# Patient Record
Sex: Female | Born: 1970 | Hispanic: No | Marital: Married | Smoking: Never smoker
Health system: Southern US, Community
[De-identification: ages and names within clinical notes are randomized; demographics above are authoritative.]

## PROBLEM LIST (undated history)

## (undated) DIAGNOSIS — Z789 Other specified health status: Secondary | ICD-10-CM

---

## 1998-08-05 HISTORY — PX: OTHER SURGICAL HISTORY: SHX169

## 2002-08-05 HISTORY — PX: SEPTOPLASTY: SUR1290

## 2009-06-13 ENCOUNTER — Other Ambulatory Visit: Admission: RE | Admit: 2009-06-13 | Discharge: 2009-06-13 | Payer: Self-pay | Admitting: Family Medicine

## 2009-09-26 ENCOUNTER — Other Ambulatory Visit: Admission: RE | Admit: 2009-09-26 | Discharge: 2009-09-26 | Payer: Self-pay | Admitting: Family Medicine

## 2010-08-26 ENCOUNTER — Encounter: Payer: Self-pay | Admitting: Family Medicine

## 2010-10-10 ENCOUNTER — Other Ambulatory Visit (HOSPITAL_COMMUNITY)
Admission: RE | Admit: 2010-10-10 | Discharge: 2010-10-10 | Disposition: A | Discharge status: Home / Self Care | Payer: BC Managed Care – PPO | Source: Ambulatory Visit | Attending: Family Medicine | Admitting: Family Medicine

## 2010-10-10 ENCOUNTER — Other Ambulatory Visit: Payer: Self-pay | Admitting: Family Medicine

## 2010-10-10 DIAGNOSIS — Z124 Encounter for screening for malignant neoplasm of cervix: Secondary | ICD-10-CM | POA: Insufficient documentation

## 2012-05-26 ENCOUNTER — Encounter (INDEPENDENT_AMBULATORY_CARE_PROVIDER_SITE_OTHER): Payer: Self-pay | Admitting: Surgery

## 2012-05-29 ENCOUNTER — Ambulatory Visit (INDEPENDENT_AMBULATORY_CARE_PROVIDER_SITE_OTHER): Payer: BC Managed Care – PPO | Admitting: General Surgery

## 2012-05-29 ENCOUNTER — Encounter (INDEPENDENT_AMBULATORY_CARE_PROVIDER_SITE_OTHER): Payer: Self-pay | Admitting: General Surgery

## 2012-05-29 VITALS — BP 116/81 | HR 83 | Temp 98.2°F | Resp 16 | Ht 63.0 in | Wt 118.6 lb

## 2012-05-29 DIAGNOSIS — N63 Unspecified lump in unspecified breast: Secondary | ICD-10-CM | POA: Insufficient documentation

## 2012-06-03 NOTE — Progress Notes (Signed)
Patient ID: Shannon Skiff, MD, female   DOB: Jan 18, 1971, 40 y.o.   MRN: [id number]  Chief Complaint  Patient presents with  . 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  Referred by Dr. Vickey Sages HPI 79 yof who has known right breast mass for some time.  This mass now causes her some pain and she thinks it has increased in size.  She would like to have this excised.  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  She comes in today to discuss options.  History reviewed. No pertinent past medical history.  Past Surgical History  Procedure Date  . Calf implants 2000    bil    History reviewed. No pertinent family history.  Social History History  Substance Use Topics  . Smoking status: Never Smoker   . Smokeless tobacco: Not on file  . Alcohol Use: No    Allergies  Allergen Reactions  . Penicillins     Skin reaction    No current outpatient prescriptions on file.    Review of Systems Review of Systems  Constitutional: Negative for fever, chills and unexpected weight change.  HENT: Negative for hearing loss, congestion, sore throat, trouble swallowing and voice change.   Eyes: Negative for visual disturbance.  Respiratory: Negative for cough and wheezing.   Cardiovascular: Negative for chest pain, palpitations and leg swelling.  Gastrointestinal: Negative for nausea, vomiting, abdominal pain, diarrhea, constipation, blood in stool, abdominal distention and anal bleeding.  Genitourinary: Negative for hematuria, vaginal bleeding and difficulty urinating.  Musculoskeletal: Negative for arthralgias.  Skin: Negative for rash and wound.  Neurological: Negative for seizures, syncope and headaches.  Hematological: Negative for adenopathy. Does not bruise/bleed easily.  Psychiatric/Behavioral: Negative for confusion.    Blood pressure 116/81,  pulse 83, temperature 98.2 F (36.8 C), temperature source Temporal, resp. rate 16, height 5\' 3"  (1.6 m), weight 118 lb 9.6 oz (53.797 kg).  Physical Exam Physical Exam  Vitals reviewed. Constitutional: She appears well-developed and well-nourished.  Cardiovascular: Normal rate, regular rhythm and normal heart sounds.   Pulmonary/Chest: Effort normal and breath sounds normal. She has no wheezes. She has no rales. Right breast exhibits mass. Right breast exhibits no inverted nipple, no nipple discharge, no skin change and no tenderness. Left breast exhibits no inverted nipple, no mass, no nipple discharge, no skin change and no tenderness. Breasts are symmetrical.    Lymphadenopathy:    She has no cervical adenopathy.    She has no axillary adenopathy.       Right: No supraclavicular adenopathy present.       Left: No supraclavicular adenopathy present.    Data Reviewed Prior notes reviewed  Assessment    Right breast mass , likely fibroadenoma    Plan   Excision of right breast mass  We discussed observation vs surgery.  I recommended excision as this is getting larger.  We discussed right breast mass excisional biopsy.       Alzada Brazee 06/03/2012, 9:27 PM

## 2012-06-05 ENCOUNTER — Encounter (HOSPITAL_BASED_OUTPATIENT_CLINIC_OR_DEPARTMENT_OTHER): Payer: Self-pay | Admitting: *Deleted

## 2012-06-05 NOTE — Progress Notes (Signed)
Will come in for [facility] labs

## 2012-06-08 ENCOUNTER — Encounter (HOSPITAL_BASED_OUTPATIENT_CLINIC_OR_DEPARTMENT_OTHER)
Admission: RE | Admit: 2012-06-08 | Discharge: 2012-06-08 | Disposition: A | Discharge status: Home / Self Care | Payer: BC Managed Care – PPO | Source: Ambulatory Visit | Attending: General Surgery | Admitting: General Surgery

## 2012-06-08 ENCOUNTER — Ambulatory Visit (INDEPENDENT_AMBULATORY_CARE_PROVIDER_SITE_OTHER): Payer: BC Managed Care – PPO | Admitting: Surgery

## 2012-06-08 LAB — CBC WITH DIFFERENTIAL/PLATELET
Basophils Absolute: 0 10*3/uL (ref 0.0–0.1)
Eosinophils Absolute: 0 10*3/uL (ref 0.0–0.7)
Eosinophils Relative: 1 % (ref 0–5)
Lymphocytes Relative: 31 % (ref 12–46)
Lymphs Abs: 1.6 10*3/uL (ref 0.7–4.0)
Neutrophils Relative %: 62 % (ref 43–77)
Platelets: 290 10*3/uL (ref 150–400)
RBC: 4.75 MIL/uL (ref 3.87–5.11)
RDW: 13 % (ref 11.5–15.5)
WBC: 5 10*3/uL (ref 4.0–10.5)

## 2012-06-08 LAB — BASIC METABOLIC PANEL
Calcium: 10.4 mg/dL (ref 8.4–10.5)
Creatinine, Ser: 0.47 mg/dL — ABNORMAL LOW (ref 0.50–1.10)
GFR calc non Af Amer: >90 mL/min (ref >90)
Sodium: 140 mEq/L (ref 135–145)

## 2012-06-09 ENCOUNTER — Ambulatory Visit (INDEPENDENT_AMBULATORY_CARE_PROVIDER_SITE_OTHER): Payer: BC Managed Care – PPO | Admitting: Surgery

## 2012-06-10 ENCOUNTER — Encounter (HOSPITAL_BASED_OUTPATIENT_CLINIC_OR_DEPARTMENT_OTHER): Payer: Self-pay | Admitting: Anesthesiology

## 2012-06-10 ENCOUNTER — Ambulatory Visit (HOSPITAL_BASED_OUTPATIENT_CLINIC_OR_DEPARTMENT_OTHER)
Admission: RE | Admit: 2012-06-10 | Discharge: 2012-06-10 | Disposition: A | Discharge status: Home / Self Care | Payer: BC Managed Care – PPO | Source: Ambulatory Visit | Attending: General Surgery | Admitting: General Surgery

## 2012-06-10 ENCOUNTER — Ambulatory Visit (HOSPITAL_BASED_OUTPATIENT_CLINIC_OR_DEPARTMENT_OTHER): Payer: BC Managed Care – PPO | Admitting: Anesthesiology

## 2012-06-10 ENCOUNTER — Encounter (HOSPITAL_BASED_OUTPATIENT_CLINIC_OR_DEPARTMENT_OTHER)
Admission: RE | Disposition: A | Discharge status: Home / Self Care | Payer: Self-pay | Source: Ambulatory Visit | Attending: General Surgery

## 2012-06-10 ENCOUNTER — Encounter (HOSPITAL_BASED_OUTPATIENT_CLINIC_OR_DEPARTMENT_OTHER): Payer: Self-pay

## 2012-06-10 DIAGNOSIS — Z01812 Encounter for preprocedural laboratory examination: Secondary | ICD-10-CM | POA: Insufficient documentation

## 2012-06-10 DIAGNOSIS — D249 Benign neoplasm of unspecified breast: Secondary | ICD-10-CM | POA: Insufficient documentation

## 2012-06-10 HISTORY — PX: MASS EXCISION: SHX2000

## 2012-06-10 HISTORY — DX: Other specified health status: Z78.9

## 2012-06-10 SURGERY — EXCISION MASS
Anesthesia: General | Site: Breast | Laterality: Right | Wound class: Clean

## 2012-06-10 MED ORDER — MIDAZOLAM HCL 5 MG/5ML IJ SOLN
INTRAMUSCULAR | Status: DC | PRN
  Administered 2012-06-10: 2 mg via INTRAVENOUS

## 2012-06-10 MED ORDER — FENTANYL CITRATE 0.05 MG/ML IJ SOLN
INTRAMUSCULAR | Status: DC | PRN
  Administered 2012-06-10: 50 ug via INTRAVENOUS

## 2012-06-10 MED ORDER — OXYCODONE HCL 5 MG/5ML PO SOLN: 5.0000 mg | Freq: Once | ORAL | Status: DC | PRN

## 2012-06-10 MED ORDER — MEPERIDINE HCL 25 MG/ML IJ SOLN: 6.2500 mg | INTRAMUSCULAR | Status: DC | PRN

## 2012-06-10 MED ORDER — LIDOCAINE HCL (CARDIAC) 20 MG/ML IV SOLN
INTRAVENOUS | Status: DC | PRN
  Administered 2012-06-10: 80 mg via INTRAVENOUS

## 2012-06-10 MED ORDER — PROMETHAZINE HCL 25 MG/ML IJ SOLN: 6.2500 mg | INTRAMUSCULAR | Status: DC | PRN

## 2012-06-10 MED ORDER — PROPOFOL 10 MG/ML IV BOLUS
INTRAVENOUS | Status: DC | PRN
  Administered 2012-06-10: 180 mg via INTRAVENOUS

## 2012-06-10 MED ORDER — DEXAMETHASONE SODIUM PHOSPHATE 4 MG/ML IJ SOLN
INTRAMUSCULAR | Status: DC | PRN
  Administered 2012-06-10: 10 mg via INTRAVENOUS

## 2012-06-10 MED ORDER — OXYCODONE HCL 5 MG PO TABS: 5.0000 mg | ORAL_TABLET | Freq: Once | ORAL | Status: DC | PRN

## 2012-06-10 MED ORDER — OXYCODONE-ACETAMINOPHEN 5-325 MG PO TABS: 1.0000 | ORAL_TABLET | ORAL | Status: DC | PRN

## 2012-06-10 MED ORDER — LACTATED RINGERS IV SOLN
INTRAVENOUS | Status: DC
  Administered 2012-06-10: 10 mL/h via INTRAVENOUS
  Administered 2012-06-10: 08:00:00 via INTRAVENOUS

## 2012-06-10 MED ORDER — BUPIVACAINE HCL (PF) 0.25 % IJ SOLN
INTRAMUSCULAR | Status: DC | PRN
  Administered 2012-06-10: 20 mL

## 2012-06-10 MED ORDER — HYDROMORPHONE HCL PF 1 MG/ML IJ SOLN: 0.2500 mg | INTRAMUSCULAR | Status: DC | PRN

## 2012-06-10 MED ORDER — ONDANSETRON HCL 4 MG/2ML IJ SOLN
INTRAMUSCULAR | Status: DC | PRN
  Administered 2012-06-10: 4 mg via INTRAVENOUS

## 2012-06-10 SURGICAL SUPPLY — 51 items
BENZOIN TINCTURE PRP APPL 2/3 (GAUZE/BANDAGES/DRESSINGS) ×2 IMPLANT
BLADE SURG 15 STRL LF DISP TIS (BLADE) ×1 IMPLANT
BLADE SURG 15 STRL SS (BLADE) ×1
BLADE SURG ROTATE 9660 (MISCELLANEOUS) IMPLANT
CANISTER SUCTION 1200CC (MISCELLANEOUS) ×2 IMPLANT
CHLORAPREP W/TINT 26ML (MISCELLANEOUS) ×2 IMPLANT
CLOTH BEACON ORANGE TIMEOUT ST (SAFETY) ×2 IMPLANT
COVER MAYO STAND STRL (DRAPES) ×2 IMPLANT
COVER TABLE BACK 60X90 (DRAPES) ×2 IMPLANT
DECANTER SPIKE VIAL GLASS SM (MISCELLANEOUS) IMPLANT
DERMABOND ADVANCED (GAUZE/BANDAGES/DRESSINGS) ×1
DERMABOND ADVANCED .7 DNX12 (GAUZE/BANDAGES/DRESSINGS) ×1 IMPLANT
DRAPE PED LAPAROTOMY (DRAPES) ×2 IMPLANT
DRSG TEGADERM 4X4.75 (GAUZE/BANDAGES/DRESSINGS) ×2 IMPLANT
ELECT COATED BLADE 2.86 ST (ELECTRODE) IMPLANT
ELECT REM PT RETURN 9FT ADLT (ELECTROSURGICAL) ×2
ELECTRODE REM PT RTRN 9FT ADLT (ELECTROSURGICAL) ×1 IMPLANT
GAUZE PACKING IODOFORM 1/4X5 (PACKING) IMPLANT
GAUZE SPONGE 4X4 12PLY STRL LF (GAUZE/BANDAGES/DRESSINGS) ×2 IMPLANT
GLOVE BIO SURGEON STRL SZ7 (GLOVE) ×2 IMPLANT
GLOVE BIOGEL PI IND STRL 7.5 (GLOVE) ×1 IMPLANT
GLOVE BIOGEL PI INDICATOR 7.5 (GLOVE) ×1
GLOVE EXAM NITRILE MD LF STRL (GLOVE) ×2 IMPLANT
GLOVE INDICATOR 7.0 STRL GRN (GLOVE) ×2 IMPLANT
GOWN PREVENTION PLUS XLARGE (GOWN DISPOSABLE) ×2 IMPLANT
NDL SAFETY ECLIPSE 18X1.5 (NEEDLE) ×1 IMPLANT
NEEDLE HYPO 18GX1.5 SHARP (NEEDLE) ×1
NEEDLE HYPO 25X1 1.5 SAFETY (NEEDLE) ×2 IMPLANT
NS IRRIG 1000ML POUR BTL (IV SOLUTION) ×2 IMPLANT
PACK BASIN DAY SURGERY FS (CUSTOM PROCEDURE TRAY) ×2 IMPLANT
PENCIL BUTTON HOLSTER BLD 10FT (ELECTRODE) ×2 IMPLANT
STRIP CLOSURE SKIN 1/2X4 (GAUZE/BANDAGES/DRESSINGS) ×2 IMPLANT
SUT ETHILON 2 0 FS 18 (SUTURE) IMPLANT
SUT MNCRL AB 4-0 PS2 18 (SUTURE) ×2 IMPLANT
SUT MON AB 5-0 PS2 18 (SUTURE) ×2 IMPLANT
SUT SILK 2 0 SH (SUTURE) IMPLANT
SUT VIC AB 2-0 SH 27 (SUTURE) ×1
SUT VIC AB 2-0 SH 27XBRD (SUTURE) ×1 IMPLANT
SUT VIC AB 3-0 SH 27 (SUTURE) ×1
SUT VIC AB 3-0 SH 27X BRD (SUTURE) ×1 IMPLANT
SUT VICRYL 3-0 CR8 SH (SUTURE) IMPLANT
SUT VICRYL 4-0 PS2 18IN ABS (SUTURE) IMPLANT
SWAB CULTURE LIQ STUART DBL (MISCELLANEOUS) IMPLANT
SYR CONTROL 10ML LL (SYRINGE) ×2 IMPLANT
TOWEL OR 17X24 6PK STRL BLUE (TOWEL DISPOSABLE) ×2 IMPLANT
TOWEL OR NON WOVEN STRL DISP B (DISPOSABLE) ×2 IMPLANT
TUBE ANAEROBIC SPECIMEN COL (MISCELLANEOUS) IMPLANT
TUBE CONNECTING 20X1/4 (TUBING) ×2 IMPLANT
UNDERPAD 30X30 INCONTINENT (UNDERPADS AND DIAPERS) ×2 IMPLANT
WATER STERILE IRR 1000ML POUR (IV SOLUTION) IMPLANT
YANKAUER SUCT BULB TIP NO VENT (SUCTIONS) ×2 IMPLANT

## 2012-06-10 NOTE — Anesthesia Procedure Notes (Signed)
Procedure Name: LMA Insertion Date/Time: 06/10/2012 8:43 AM Performed by: Gar Gibbon Pre-anesthesia Checklist: Patient identified, Emergency Drugs available, Suction available and Patient being monitored Patient Re-evaluated:Patient Re-evaluated prior to inductionOxygen Delivery Method: Circle System Utilized Preoxygenation: Pre-oxygenation with 100% oxygen Intubation Type: IV induction Ventilation: Mask ventilation without difficulty LMA: LMA inserted LMA Size: 3.0 Number of attempts: 2 Airway Equipment and Method: bite block Placement Confirmation: positive ETCO2 Tube secured with: Tape Dental Injury: Teeth and Oropharynx as per pre-operative assessment and Bloody posterior oropharynx  Comments: #4 LMA on first attempt , unable to seat;removed w blood tinged sputum on LMA.  #3 LMA placed with ease.

## 2012-06-10 NOTE — Transfer of Care (Signed)
Immediate Anesthesia Transfer of Care Note  Patient: Shannon Skiff, MD  Procedure(s) Performed: Procedure(s) (LRB) with comments: EXCISION MASS (Right) - right breast mass excision  Patient Location: PACU  Anesthesia Type:General  Level of Consciousness: sedated and patient cooperative  Airway & Oxygen Therapy: Patient Spontanous Breathing and Patient connected to face mask oxygen  Post-op Assessment: Report given to PACU RN and Post -op Vital signs reviewed and stable  Post vital signs: Reviewed and stable  Complications: No apparent anesthesia complications

## 2012-06-10 NOTE — H&P (View-Only) (Signed)
Patient ID: Shannon I. Munoz-Ali, MD, female   DOB: 02/23/1971, 41 y.o.   MRN: [id number]  Chief Complaint  Patient presents with  . Breast Problem    HPI Shannon I. 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  Referred by Dr. Atkins HPI 41 yof who has known right breast mass for some time.  This mass now causes her some pain and she thinks it has increased in size.  She would like to have this excised.  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  She comes in today to discuss options.  History reviewed. No pertinent past medical history.  Past Surgical History  Procedure Date  . Calf implants 2000    bil    History reviewed. No pertinent family history.  Social History History  Substance Use Topics  . Smoking status: Never Smoker   . Smokeless tobacco: Not on file  . Alcohol Use: No    Allergies  Allergen Reactions  . Penicillins     Skin reaction    No current outpatient prescriptions on file.    Review of Systems Review of Systems  Constitutional: Negative for fever, chills and unexpected weight change.  HENT: Negative for hearing loss, congestion, sore throat, trouble swallowing and voice change.   Eyes: Negative for visual disturbance.  Respiratory: Negative for cough and wheezing.   Cardiovascular: Negative for chest pain, palpitations and leg swelling.  Gastrointestinal: Negative for nausea, vomiting, abdominal pain, diarrhea, constipation, blood in stool, abdominal distention and anal bleeding.  Genitourinary: Negative for hematuria, vaginal bleeding and difficulty urinating.  Musculoskeletal: Negative for arthralgias.  Skin: Negative for rash and wound.  Neurological: Negative for seizures, syncope and headaches.  Hematological: Negative for adenopathy. Does not bruise/bleed easily.  Psychiatric/Behavioral: Negative for confusion.    Blood pressure 116/81,  pulse 83, temperature 98.2 F (36.8 C), temperature source Temporal, resp. rate 16, height 5' 3" (1.6 m), weight 118 lb 9.6 oz (53.797 kg).  Physical Exam Physical Exam  Vitals reviewed. Constitutional: She appears well-developed and well-nourished.  Cardiovascular: Normal rate, regular rhythm and normal heart sounds.   Pulmonary/Chest: Effort normal and breath sounds normal. She has no wheezes. She has no rales. Right breast exhibits mass. Right breast exhibits no inverted nipple, no nipple discharge, no skin change and no tenderness. Left breast exhibits no inverted nipple, no mass, no nipple discharge, no skin change and no tenderness. Breasts are symmetrical.    Lymphadenopathy:    She has no cervical adenopathy.    She has no axillary adenopathy.       Right: No supraclavicular adenopathy present.       Left: No supraclavicular adenopathy present.    Data Reviewed Prior notes reviewed  Assessment    Right breast mass , likely fibroadenoma    Plan   Excision of right breast mass  We discussed observation vs surgery.  I recommended excision as this is getting larger.  We discussed right breast mass excisional biopsy.       Shannon Hardin 06/03/2012, 9:27 PM    

## 2012-06-10 NOTE — Interval H&P Note (Signed)
History and Physical Interval Note:  06/10/2012 8:31 AM  Marjie Skiff, MD  has presented today for surgery, with the diagnosis of right breast mass  The various methods of treatment have been discussed with the patient and family. After consideration of risks, benefits and other options for treatment, the patient has consented to  Procedure(s) (LRB) with comments: EXCISION MASS (Right) - right breast mass excision as a surgical intervention .  The patient's history has been reviewed, patient examined, no change in status, stable for surgery.  I have reviewed the patient's chart and labs.  Questions were answered to the patient's satisfaction.     Ilisha Blust

## 2012-06-10 NOTE — Anesthesia Postprocedure Evaluation (Signed)
  Anesthesia Post-op Note  Patient: Marjie Skiff, MD  Procedure(s) Performed: Procedure(s) (LRB) with comments: EXCISION MASS (Right) - right breast mass excision  Patient Location: PACU  Anesthesia Type:General  Level of Consciousness: awake  Airway and Oxygen Therapy: Patient Spontanous Breathing  Post-op Pain: mild  Post-op Assessment: Post-op Vital signs reviewed, Patient's Cardiovascular Status Stable and Respiratory Function Stable  Post-op Vital Signs: stable  Complications: No apparent anesthesia complications

## 2012-06-10 NOTE — Op Note (Signed)
Preoperative diagnosis: Right breast mass Postoperative diagnosis: Same as above Procedure: Right breast mass excisional biopsy Surgeon: Dr. Harden Mo Anesthesia: Gen. 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 This has previously undergone a core biopsy consistent with a fibroadenoma. Recently this area is enlarged to her clinically and has begun to cause her some discomfort. We discussed an excisional biopsy of this area due to the enlargement.  Procedure: After informed consent was obtained the patient was taken to the operating room. She has sequential compression devices on her legs. She was then placed under general anesthesia with an LMA. The right breast was prepped and draped in the standard sterile surgical fashion. Surgical timeout was performed.  I infiltrated quarter percent Marcaine throughout the right breast in the region of fibroadenoma. I then made a periareolar incision. I carried this out down into the right lower outer quadrant where the fibroadenoma was located and removed it through this incision. This clinically appeared to be a fibroadenoma. Hemostasis was then obtained. I did not mark it due to the fact that I lost the orientation as was the removed and clinically appeared to be a fibroadenoma. I then closed the breast tissue a 2-0 Vicryl. I closed the dermis with 3-0 Vicryl. I closed the skin with 5-0 Monocryl and placed Dermabond and Steri-Strips. She tolerated this well was transferred to recovery stable.

## 2012-06-10 NOTE — Anesthesia Preprocedure Evaluation (Signed)
Anesthesia Evaluation  Patient identified by MRN, date of birth, ID band Patient awake    Reviewed: Allergy & Precautions, H&P , NPO status , Patient's Chart, lab work & pertinent test results  History of Anesthesia Complications Negative for: history of anesthetic complications  Airway Mallampati: I      Dental No notable dental hx. (+) Teeth Intact   Pulmonary neg pulmonary ROS,  breath sounds clear to auscultation  Pulmonary exam normal       Cardiovascular negative cardio ROS  IRhythm:regular Rate:Normal     Neuro/Psych negative neurological ROS  negative psych ROS   GI/Hepatic negative GI ROS, Neg liver ROS,   Endo/Other  negative endocrine ROS  Renal/GU negative Renal ROS  negative genitourinary   Musculoskeletal   Abdominal   Peds  Hematology negative hematology ROS (+)   Anesthesia Other Findings   Reproductive/Obstetrics negative OB ROS                           Anesthesia Physical Anesthesia Plan  ASA: I  Anesthesia Plan: General   Post-op Pain Management:    Induction:   Airway Management Planned:   Additional Equipment:   Intra-op Plan:   Post-operative Plan:   Informed Consent: I have reviewed the patients History and Physical, chart, labs and discussed the procedure including the risks, benefits and alternatives for the proposed anesthesia with the patient or authorized representative who has indicated his/her understanding and acceptance.     Plan Discussed with: CRNA and Surgeon  Anesthesia Plan Comments:         Anesthesia Quick Evaluation

## 2012-06-11 ENCOUNTER — Telehealth (INDEPENDENT_AMBULATORY_CARE_PROVIDER_SITE_OTHER): Payer: Self-pay

## 2012-06-11 ENCOUNTER — Telehealth (INDEPENDENT_AMBULATORY_CARE_PROVIDER_SITE_OTHER): Payer: Self-pay | Admitting: General Surgery

## 2012-06-11 ENCOUNTER — Encounter (HOSPITAL_BASED_OUTPATIENT_CLINIC_OR_DEPARTMENT_OTHER): Payer: Self-pay | Admitting: General Surgery

## 2012-06-11 NOTE — Telephone Encounter (Signed)
Pt called for path result. Pt given path result.

## 2012-06-11 NOTE — Telephone Encounter (Signed)
LMOM asking pt return my call. 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

## 2012-06-25 ENCOUNTER — Encounter (INDEPENDENT_AMBULATORY_CARE_PROVIDER_SITE_OTHER): Payer: Self-pay | Admitting: General Surgery

## 2012-06-25 ENCOUNTER — Ambulatory Visit (INDEPENDENT_AMBULATORY_CARE_PROVIDER_SITE_OTHER): Payer: BC Managed Care – PPO | Admitting: General Surgery

## 2012-06-25 VITALS — BP 110/74 | HR 72 | Temp 99.1°F | Resp 14 | Ht 63.0 in | Wt 116.0 lb

## 2012-06-25 DIAGNOSIS — Z09 Encounter for follow-up examination after completed treatment for conditions other than malignant neoplasm: Secondary | ICD-10-CM

## 2012-06-25 NOTE — Progress Notes (Signed)
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   MRN: [id number]  HPI This is a 41 year old female who had a large right breast mass consistent with a fibroadenoma. 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 She returns today without any complaints.  Review of Systems     Objective:   Physical Exam Well-healing right periareaolar incision    Assessment:     Status post fibroadenoma excision    Plan:     We discussed going back to normal activity. She needs to return just a normal breast cancer screening with annual mammograms, annual clinical exams, and monthly self exams. I will see her as needed.

## 2012-06-25 NOTE — Patient Instructions (Signed)

## 2012-09-16 ENCOUNTER — Encounter (INDEPENDENT_AMBULATORY_CARE_PROVIDER_SITE_OTHER): Payer: Self-pay

## 2012-10-27 ENCOUNTER — Encounter (INDEPENDENT_AMBULATORY_CARE_PROVIDER_SITE_OTHER): Payer: Self-pay

## 2015-03-27 ENCOUNTER — Other Ambulatory Visit: Payer: Self-pay | Admitting: Family Medicine

## 2015-03-27 ENCOUNTER — Ambulatory Visit
Admission: RE | Admit: 2015-03-27 | Discharge: 2015-03-27 | Disposition: A | Discharge status: Home / Self Care | Payer: BC Managed Care – PPO | Source: Ambulatory Visit | Attending: Family Medicine | Admitting: Family Medicine

## 2015-03-27 DIAGNOSIS — R0789 Other chest pain: Secondary | ICD-10-CM

## 2015-04-19 ENCOUNTER — Other Ambulatory Visit: Payer: Self-pay | Admitting: Radiology

## 2016-08-26 IMAGING — CR DG CHEST 2V
2 series · 2 of 2 positions shown · non-contrast
Comparison: None.

CLINICAL DATA: Right-sided chest pain and cough for 1 week.

EXAM:
CHEST  2 VIEW

[view not recorded (1 of 2)]
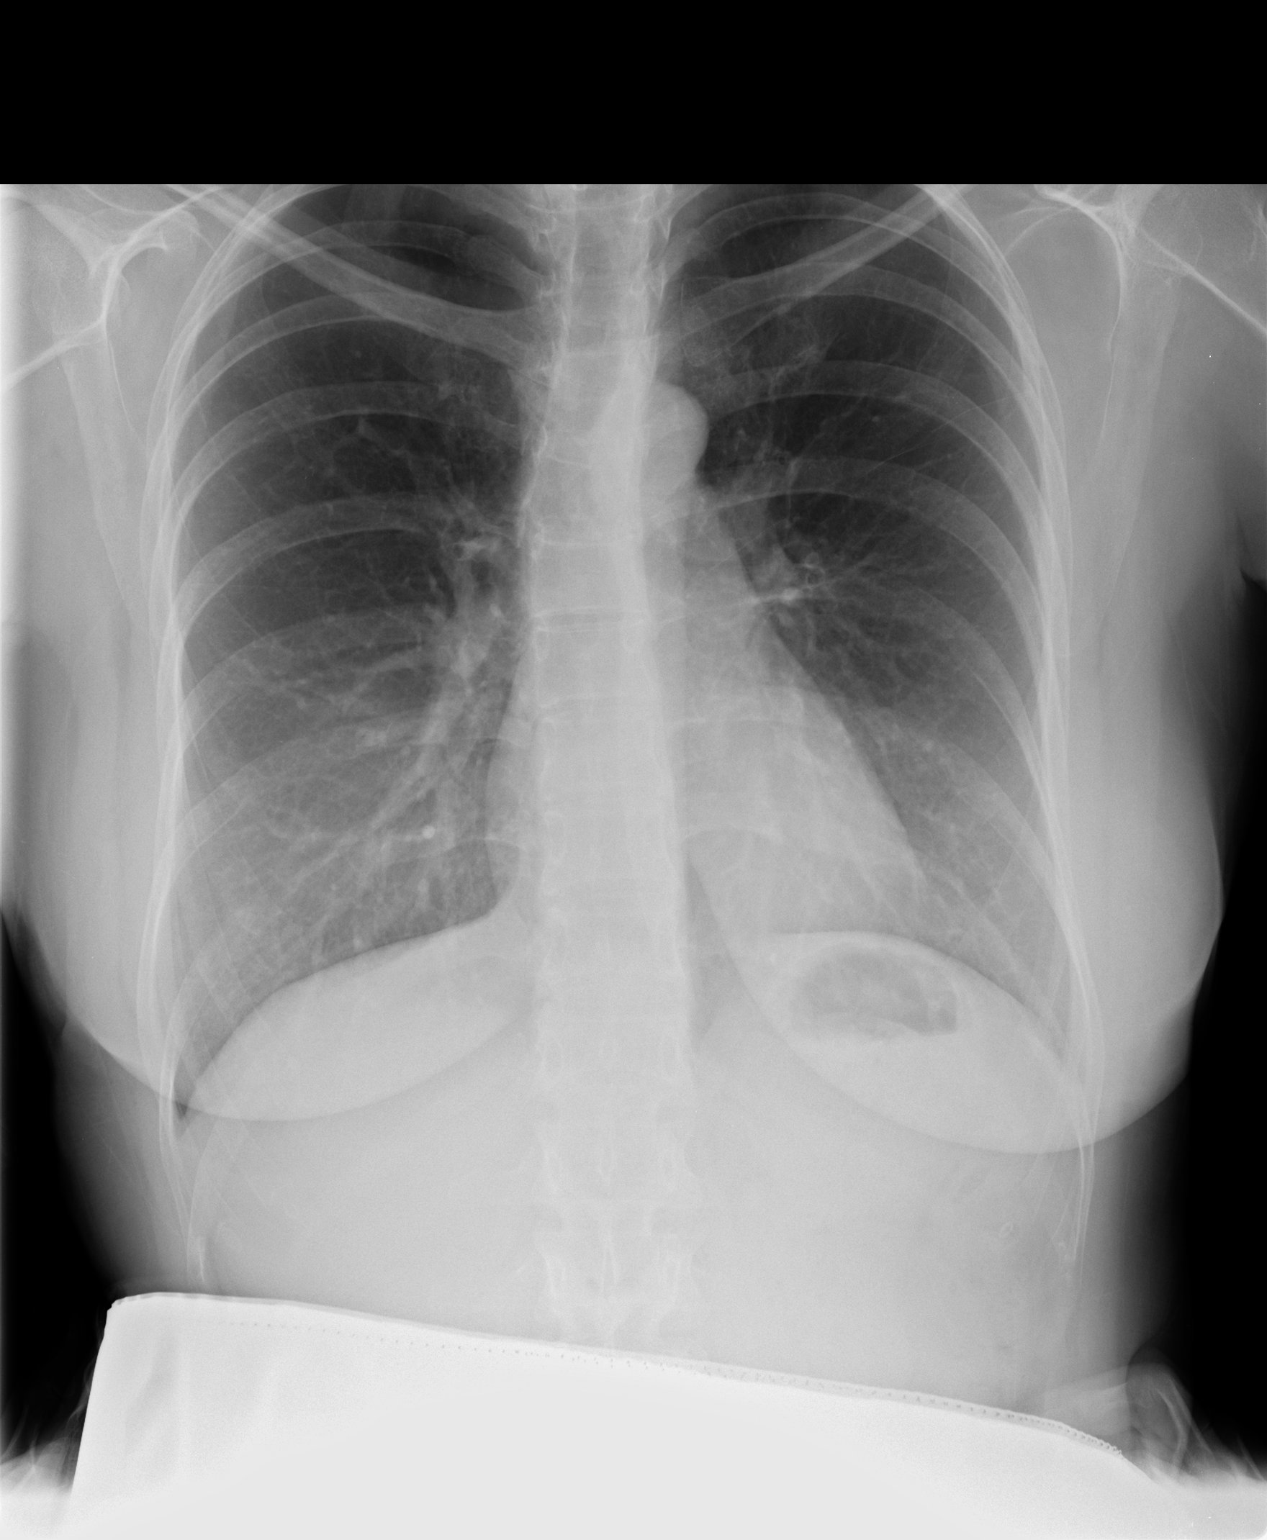

[view not recorded (2 of 2)]
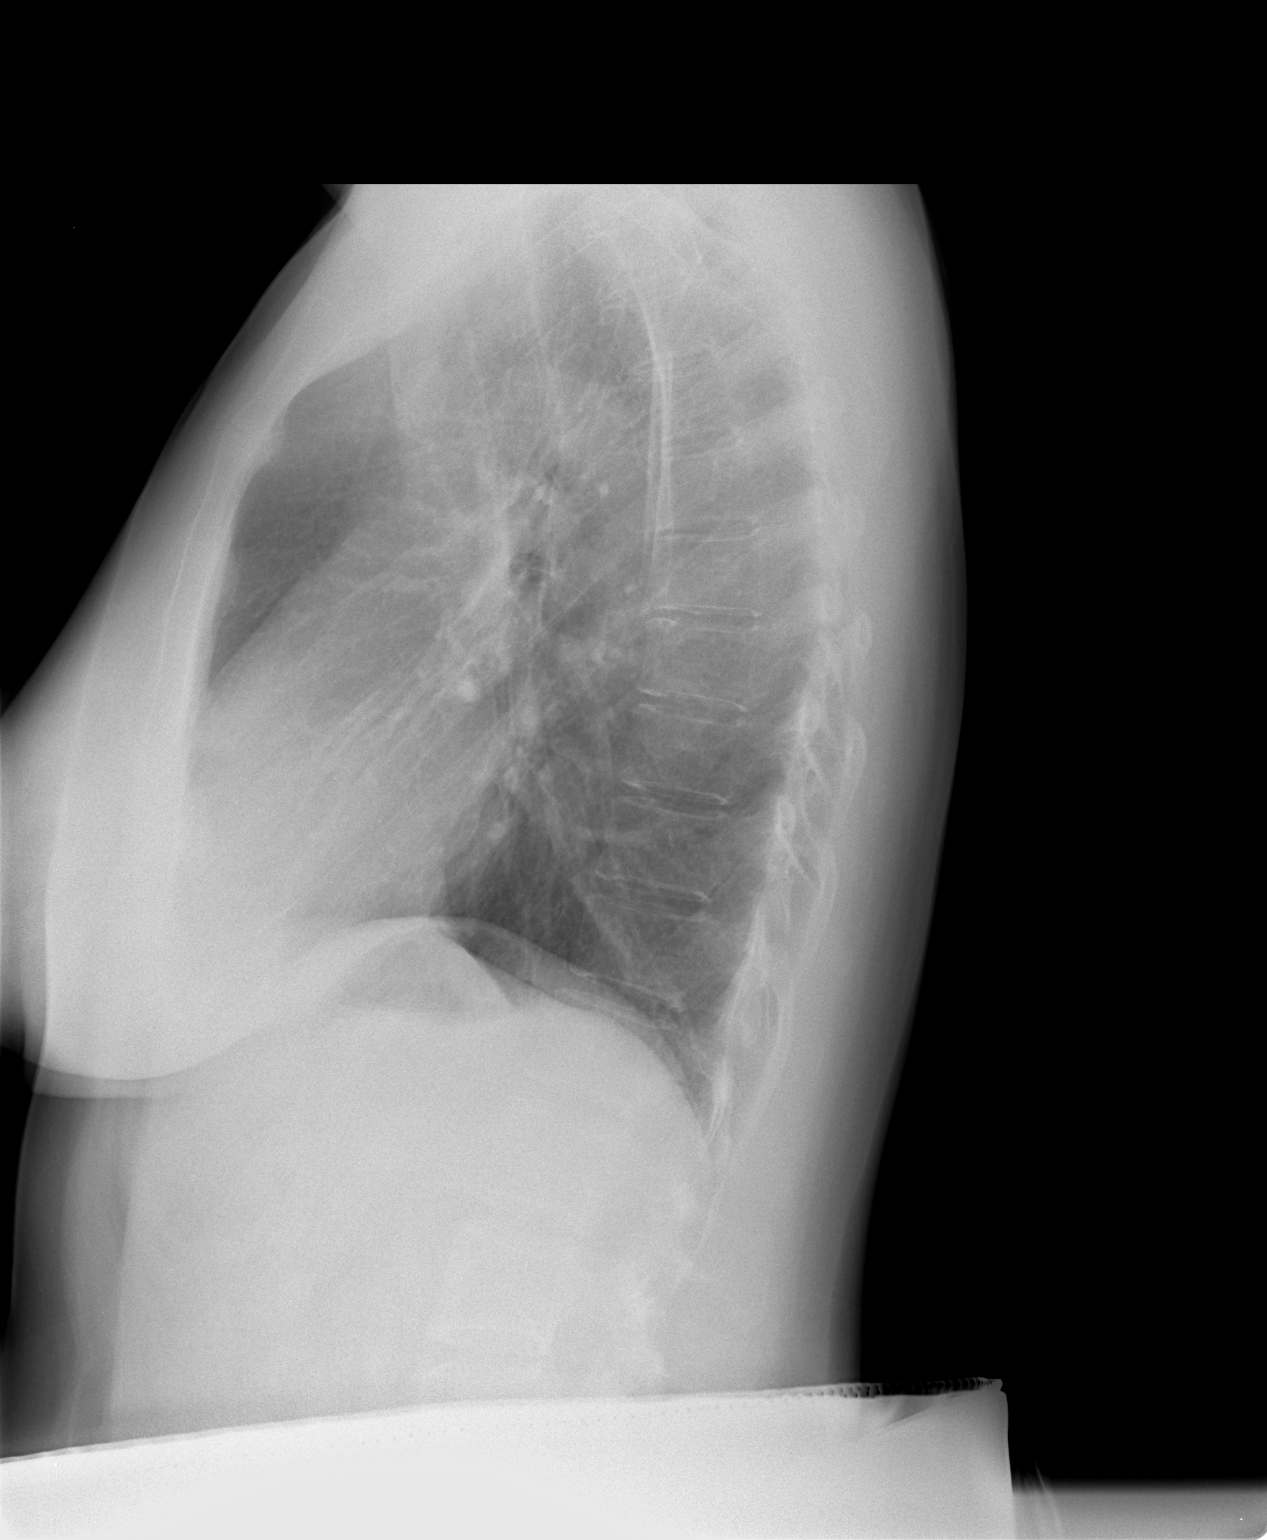

[2 of 2 positions shown; findings below may reference images not displayed]

FINDINGS: The heart size and mediastinal contours are within normal limits.
Both lungs are clear. No evidence of pneumothorax or pleural
effusion.
IMPRESSION: Negative.  No active cardiopulmonary disease.

## 2017-06-27 ENCOUNTER — Other Ambulatory Visit: Payer: Self-pay

## 2017-06-27 ENCOUNTER — Encounter (HOSPITAL_COMMUNITY): Payer: Self-pay | Admitting: Emergency Medicine

## 2017-06-27 ENCOUNTER — Ambulatory Visit (HOSPITAL_COMMUNITY)
Admission: EM | Admit: 2017-06-27 | Discharge: 2017-06-27 | Disposition: A | Discharge status: Home / Self Care | Payer: BC Managed Care – PPO | Attending: Emergency Medicine | Admitting: Emergency Medicine

## 2017-06-27 DIAGNOSIS — W19XXXA Unspecified fall, initial encounter: Secondary | ICD-10-CM

## 2017-06-27 DIAGNOSIS — S40012A Contusion of left shoulder, initial encounter: Secondary | ICD-10-CM

## 2017-06-27 DIAGNOSIS — S5002XA Contusion of left elbow, initial encounter: Secondary | ICD-10-CM | POA: Diagnosis not present

## 2017-06-27 DIAGNOSIS — S20222A Contusion of left back wall of thorax, initial encounter: Secondary | ICD-10-CM | POA: Diagnosis not present

## 2017-06-27 NOTE — ED Triage Notes (Signed)
Pt states "last night at 9pm I slipped on the stairs at night, everything on the left side is hurting" Pt states there were three steps. Denies hitting head or LOC. C/o L shoulder pain, L elbow pain.

## 2017-06-27 NOTE — Discharge Instructions (Signed)
Wear the arm sling for the next 3 days. Remove it periodically to move the shoulder and elbow. Place ice over the areas of soreness for the next 2 days then move to heat to the muscles of the back. After applying heat and start performing gentle stretches. For any worsening, new symptoms or problems, not getting better in 3-4 days return or seek medical attention. May take ibuprofen as your request as needed for pain

## 2017-06-27 NOTE — ED Provider Notes (Addendum)
[facility]    CSN: [id number] Arrival date & time: 06/27/17  1033     History   Chief Complaint Chief Complaint  Patient presents with  . Fall  . Shoulder Pain  . Elbow Pain    HPI Shannon Hardin is a 46 y.o. female.   Per nurse's notes. 46 year old female fell down some steps last PM striking her left posterior shoulder and left elbow on the steps. Denies injuring her head or her neck. She has tenderness to the left posterior lateral upper back and muscles surrounding the scapula. Tenderness to the left elbow. Denies injury to the neck or spine. No injury to the other extremities. Fully awake and alert and oriented.      Past Medical History:  Diagnosis Date  . No pertinent past medical history     There are no active problems to display for this patient.   Past Surgical History:  Procedure Laterality Date  . calf implants  2000   bil  . MASS EXCISION  06/10/2012   Procedure: EXCISION MASS;  Surgeon: Rolm Bookbinder, MD;  Location: [facility];  Service: General;  Laterality: Right;  right breast mass excision  . SEPTOPLASTY  2004    OB History    No data available       Home Medications    Prior to Admission medications   Not on File    Family History No family history on file.  Social History Social History   Tobacco Use  . Smoking status: Never Smoker  Substance Use Topics  . Alcohol use: No  . Drug use: No     Allergies   Penicillins   Review of Systems Review of Systems  Constitutional: Negative for activity change, chills and fever.  HENT: Negative.   Respiratory: Negative.   Cardiovascular: Negative.   Musculoskeletal: Negative for gait problem, joint swelling, myalgias, neck pain and neck stiffness.       As per HPI  Skin: Negative for color change, pallor and rash.  Neurological: Negative.   All other systems reviewed and are negative.    Physical Exam Triage Vital Signs ED Triage  Vitals  Enc Vitals Group     BP 06/27/17 1105 130/90     Pulse Rate 06/27/17 1105 86     Resp 06/27/17 1105 16     Temp 06/27/17 1105 98.1 F (36.7 C)     Temp Source 06/27/17 1105 Oral     SpO2 06/27/17 1105 100 %     Weight --      Height --      Head Circumference --      Peak Flow --      Pain Score 06/27/17 1107 5     Pain Loc --      Pain Edu? --      Excl. in [place]? --    No data found.  Updated Vital Signs BP 130/90   Pulse 86   Temp 98.1 F (36.7 C) (Oral)   Resp 16   SpO2 100%   Visual Acuity Right Eye Distance:   Left Eye Distance:   Bilateral Distance:    Right Eye Near:   Left Eye Near:    Bilateral Near:     Physical Exam  Constitutional: She is oriented to person, place, and time. She appears well-developed and well-nourished. No distress.  HENT:  Head: Normocephalic and atraumatic.  Eyes: EOM are normal.  Neck: Normal range of motion.  Neck supple.  Cardiovascular: Normal rate and regular rhythm.  Pulmonary/Chest: Effort normal. No respiratory distress. She has no wheezes. She has no rales.  Musculoskeletal: Normal range of motion. She exhibits no edema or deformity.  Tenderness to the left. He scapular musculature. Tenderness over the infrascapular muscles. Tenderness to the muscles over the posterior ribs at the level of the inferior aspect of the scapula. Able to abduct the arm 210. Full rotation internal and external intact. Able to cross the arm. Left shoulder is symmetric with the right. Able to shrug shoulders. Tenderness is primarily of the muscles.  Left elbow with no swelling, discoloration. Mild tenderness to the olecranon. Shows full range of motion of the elbow with supination and pronation of the forearm. No discoloration no bruising no obvious bony injury.  Neurological: She is alert and oriented to person, place, and time. No cranial nerve deficit.  Skin: Skin is warm and dry.  Psychiatric: She has a normal mood and affect.  Nursing  note and vitals reviewed.    UC Treatments / Results  Labs (all labs ordered are listed, but only abnormal results are displayed) Labs Reviewed - No data to display  EKG  EKG Interpretation None       Radiology No results found.  Procedures Procedures (including critical care time)  Medications Ordered in UC Medications - No data to display   Initial Impression / Assessment and Plan / UC Course  I have reviewed the triage vital signs and the nursing notes.  Pertinent labs & imaging results that were available during my care of the patient were reviewed by me and considered in my medical decision making (see chart for details).   patient believes she does not need an x-ray of 10 to agree. She states she is satisfied with the exam and with ibuprofen at home. For any worsening, new symptoms or problems may return.  Wear the arm sling for the next 3 days. Remove it periodically to move the shoulder and elbow. Place ice over the areas of soreness for the next 2 days then move to heat to the muscles of the back. After applying heat and start performing gentle stretches. For any worsening, new symptoms or problems, not getting better in 3-4 days return or seek medical attention. May take ibuprofen as your request as needed for pain   Final Clinical Impressions(s) / UC Diagnoses   Final diagnoses:  Fall, initial encounter  Contusion of left elbow, initial encounter  Contusion of upper back, left, initial encounter    ED Discharge Orders    None       Controlled Substance Prescriptions [place] Controlled Substance Registry consulted? Not Applicable   Janne Napoleon, NP 06/27/17 [place]    Janne Napoleon, NP 06/27/17 1242

## 2020-11-28 ENCOUNTER — Ambulatory Visit: Payer: BC Managed Care – PPO | Attending: Obstetrics and Gynecology | Admitting: Physical Therapy

## 2020-11-28 ENCOUNTER — Other Ambulatory Visit: Payer: Self-pay

## 2020-11-28 ENCOUNTER — Encounter: Payer: Self-pay | Admitting: Physical Therapy

## 2020-11-28 DIAGNOSIS — R252 Cramp and spasm: Secondary | ICD-10-CM | POA: Diagnosis present

## 2020-11-28 DIAGNOSIS — M6281 Muscle weakness (generalized): Secondary | ICD-10-CM | POA: Diagnosis not present

## 2020-11-28 NOTE — Therapy (Signed)
[facility] [address], [place], [place], [address] Phone: [phone]   Fax:  [phone]  Physical Therapy Evaluation  Patient Details  Name: Shannon Hardin MRN: [id number] Date of Birth: 06-15-1971 Referring Provider (PT): Marylynn Pearson, MD   Encounter Date: 11/28/2020   PT End of Session - 11/28/20 0942    Visit Number 1    Date for PT Re-Evaluation 02/20/21    Authorization Type bcbs    PT Start Time 0937    PT Stop Time 1015    PT Time Calculation (min) 38 min    Activity Tolerance Patient tolerated treatment well    Behavior During Therapy [facility] for tasks assessed/performed           Past Medical History:  Diagnosis Date  . No pertinent past medical history     Past Surgical History:  Procedure Laterality Date  . calf implants  2000   bil  . MASS EXCISION  06/10/2012   Procedure: EXCISION MASS;  Surgeon: Rolm Bookbinder, MD;  Location: [place];  Service: General;  Laterality: Right;  right breast mass excision  . SEPTOPLASTY  2004    There were no vitals filed for this visit.    Subjective Assessment - 11/28/20 0944    Subjective Pt wants to know what do at home for resistance to improve the osteoporosis.    Pertinent History appendectomy    Currently in Pain? No/denies              [facility] PT Assessment - 11/28/20 0001      Assessment   Medical Diagnosis M81.0 (ICD-10-CM) - Age-related osteoporosis without current pathological fracture    Referring Provider (PT) Marylynn Pearson, MD      Precautions   Precautions None      Balance Screen   Has the patient fallen in the past 6 months Yes    How many times? 1   missed a step - Jan   Has the patient had a decrease in activity level because of a fear of falling?  No    Is the patient reluctant to leave their home because of a fear of falling?  No      Home Ecologist residence     Living Arrangements Spouse/significant other;Children   2 - children     Prior Function   Level of Independence Independent    Vocation Full time employment    Vocation Requirements sitting/desk work      Cognition   Overall Cognitive Status Within Functional Limits for tasks assessed      Functional Tests   Functional tests Squat;Single leg stance      Squat   Comments anterior movement bil knees and weight shifting onto toes      Single Leg Stance   Comments 10+sec less stable on Lt LE - no notable trendelenburg      Posture/Postural Control   Posture/Postural Control Postural limitations    Postural Limitations Rounded Shoulders      ROM / Strength   AROM / PROM / Strength AROM;PROM;Strength      AROM   Overall AROM Comments lumbar flex 50%      PROM   Overall PROM Comments Rt hip ER/IR 75%; Lt ER/IR 50%      Strength   Overall Strength Comments bil UE 5/5; Lt LE hip abd, add 4/5; Rt LE 5/5      Flexibility  Soft Tissue Assessment /Muscle Length yes    Hamstrings 40% bil      Palpation   Palpation comment lumbar paraspinla, gluteals, h/s tight      Special Tests   Other special tests ASLR - easier with compression - core stability limitations      Ambulation/Gait   Gait Pattern Within Functional Limits                      Objective measurements completed on examination: See above findings.       [facility] Adult PT Treatment/Exercise - 11/28/20 0001      Self-Care   Self-Care Other Self-Care Comments    Other Self-Care Comments  educated and performed intial HEP                  PT Education - 11/28/20 1017    Education Details Access Code: [email or web address]    Person(s) Educated Patient    Methods Explanation;Demonstration;Tactile cues;Verbal cues;Handout    Comprehension Verbalized understanding;Returned demonstration            PT Short Term Goals - 11/28/20 1138      PT SHORT TERM GOAL #1   Title ind with first 2 initial  HEPs    Baseline was given stretches and eval and will add basic strength next    Time 6    Period Weeks    Status New    Target Date 01/09/21             PT Long Term Goals - 11/28/20 1134      PT LONG TERM GOAL #1   Title Pt will be ind with advanced HEP for functional strength and for muscle building/maintaining activities to reduce risks associated with osteoporosis.    Time 12    Period Weeks    Status New    Target Date 02/20/21      PT LONG TERM GOAL #2   Title Pt will demonstrate functional squat without excessive forward knee movement and good posture for improved functional lifting    Time 12    Period Weeks    Status New    Target Date 02/20/21      PT LONG TERM GOAL #3   Title Pt will feel confident that she knows what activities are safe and which are not due to diagnosis of osteoporosis    Time 12    Period Weeks    Status New    Target Date 02/20/21                  Plan - 11/28/20 1139    Clinical Impression Statement Pt is pleasant 50 y/o female who presents to clinic due to osteoporosis and would like to know how to strengthen without injuries.  Pt does walk, but no history of regular exercise outside of that.  Pt is able to do SLS x 10 sec bil with less stability noted on Lt LE.  Lt hip weakness of abd/adductors of 4/5 MMT.  Pt has significant tension throughout the posterior kinetic chain with lumbar, hamstrings, and glutes tight. Rounded shoulder in standing and sitting. Pt sits a lot at desk job.  Pt has increased knee flexion and anterior movement of knees and medial arch collapse when squatting due to limited dorsiflexion movement and glute strength.  Pt will benefit from skilled PT to address impairments and get her on appropriate exercise plan to reduce risks associated with osteoporosis.  Personal Factors and Comorbidities Comorbidity 2    Comorbidities appendectomy, osteoporosis, bil calf implants    Examination-Activity Limitations  Lift;Bend    Examination-Participation Restrictions Community Activity    Stability/Clinical Decision Making Stable/Uncomplicated    Clinical Decision Making Low    Rehab Potential Excellent    PT Frequency 1x / week    PT Duration 12 weeks   suggested 4-6 visits total   PT Treatment/Interventions ADLs/Self Care Home Management;Biofeedback;Cryotherapy;Electrical Stimulation;Moist Heat;Neuromuscular re-education;Therapeutic exercise;Therapeutic activities;Patient/family education;Dry needling;Manual techniques    PT Next Visit Plan core stability with basic LE and UE strengthening    PT Home Exercise Plan Access Code: [email or web address]    Consulted and Agree with Plan of Care Patient           Patient will benefit from skilled therapeutic intervention in order to improve the following deficits and impairments:  Decreased strength,Impaired flexibility,Increased muscle spasms,Decreased range of motion  Visit Diagnosis: Muscle weakness (generalized)  Cramp and spasm     Problem List There are no problems to display for this patient.   Jule Ser, PT 11/28/2020, 2:58 PM  [facility] [address], [place], [place], [address] Phone: [phone]   Fax:  [phone]  Name: Yariela Tison MRN: [id number] Date of Birth: Apr 09, 1971

## 2020-11-28 NOTE — Patient Instructions (Signed)
Access Code: [email or web address] URL: [email or web address] Date: 11/28/2020 Prepared by: Jari Favre  Exercises Standing Gastroc Stretch - 1 x daily - 7 x weekly - 1 sets - 3 reps - 30 sec hold Soleus Stretch on Wall - 1 x daily - 7 x weekly - 1 sets - 3 reps - 30 sec hold Supine Hamstring Stretch with Strap - 1 x daily - 7 x weekly - 1 sets - 3 reps - 30 sec hold Seated Flexion Stretch - 3 x daily - 7 x weekly - 1 sets - 3 reps - 10-20 hold Seated Figure 4 Piriformis Stretch - 1 x daily - 7 x weekly - 1 sets - 3 reps - 30 hold Seated Hamstring Stretch - 1 x daily - 7 x weekly - 1 sets - 3 reps - 30 sec hold

## 2021-01-09 ENCOUNTER — Ambulatory Visit: Payer: BC Managed Care – PPO | Admitting: Physical Therapy

## 2021-01-23 ENCOUNTER — Encounter: Payer: Self-pay | Admitting: Physical Therapy

## 2021-01-23 ENCOUNTER — Ambulatory Visit: Payer: BC Managed Care – PPO | Attending: Obstetrics and Gynecology | Admitting: Physical Therapy

## 2021-01-23 ENCOUNTER — Other Ambulatory Visit: Payer: Self-pay

## 2021-01-23 DIAGNOSIS — R252 Cramp and spasm: Secondary | ICD-10-CM | POA: Insufficient documentation

## 2021-01-23 DIAGNOSIS — M6281 Muscle weakness (generalized): Secondary | ICD-10-CM | POA: Insufficient documentation

## 2021-01-23 NOTE — Therapy (Signed)
[facility] [address] [place], [place], [address] Phone: [phone]   Fax:  [phone]  Physical Therapy Treatment  Patient Details  Name: Shannon Hardin MRN: [id number] Date of Birth: 06-24-1971 Referring Provider (PT): Marylynn Pearson, MD   Encounter Date: 01/23/2021   PT End of Session - 01/23/21 0856     Visit Number 2    Date for PT Re-Evaluation 02/20/21    Authorization Type bcbs    PT Start Time [phone]   late   PT Stop Time 0928    PT Time Calculation (min) 37 min    Activity Tolerance Patient tolerated treatment well    Behavior During Therapy [facility] for tasks assessed/performed             Past Medical History:  Diagnosis Date   No pertinent past medical history     Past Surgical History:  Procedure Laterality Date   calf implants  2000   bil   MASS EXCISION  06/10/2012   Procedure: EXCISION MASS;  Surgeon: Rolm Bookbinder, MD;  Location: [place] [facility];  Service: General;  Laterality: Right;  right breast mass excision   SEPTOPLASTY  2004    There were no vitals filed for this visit.   Subjective Assessment - 01/23/21 0929     Subjective Pt reports feeling good and have been walking a lot    Currently in Pain? No/denies                               [facility] Adult PT Treatment/Exercise - 01/23/21 0001       Exercises   Exercises Other Exercises;Knee/Hip    Other Exercises  everything in new HEP as seen in chart educated and performed      Knee/Hip Exercises: Stretches   Active Hamstring Stretch Right;Left;30 seconds    Piriformis Stretch Right;Left;30 seconds      Knee/Hip Exercises: Standing   Hip Flexion Stengthening;Both;10 reps   band with all standing hip exs   Hip Abduction Both;10 reps    Hip Extension Both;10 reps    Functional Squat 15 reps   band   Gait Training side step with band regular and low    Other Standing Knee Exercises  shoulder extension - green 20x      Knee/Hip Exercises: Supine   Bridges Both;15 reps    Bridges Limitations band      Knee/Hip Exercises: Sidelying   Hip ABduction Strengthening;Both;10 reps                      PT Short Term Goals - 01/23/21 0916       PT SHORT TERM GOAL #1   Title ind with first 2 initial HEPs    Status Partially Met               PT Long Term Goals - 11/28/20 1134       PT LONG TERM GOAL #1   Title Pt will be ind with advanced HEP for functional strength and for muscle building/maintaining activities to reduce risks associated with osteoporosis.    Time 12    Period Weeks    Status New    Target Date 02/20/21      PT LONG TERM GOAL #2   Title Pt will demonstrate functional squat without excessive forward knee movement and good posture for improved functional lifting  Time 12    Period Weeks    Status New    Target Date 02/20/21      PT LONG TERM GOAL #3   Title Pt will feel confident that she knows what activities are safe and which are not due to diagnosis of osteoporosis    Time 12    Period Weeks    Status New    Target Date 02/20/21                   Plan - 01/23/21 0913     Clinical Impression Statement Pt has been walking a lot.  Pt is here today to get some education in HEP for strengthening to prevent osteoporosis related injuries.  Pt did well with exercises today and will benefit from follow up to progress and ensure successful transition to HEP    PT Treatment/Interventions ADLs/Self Care Home Management;Biofeedback;Cryotherapy;Electrical Stimulation;Moist Heat;Neuromuscular re-education;Therapeutic exercise;Therapeutic activities;Patient/family education;Dry needling;Manual techniques    PT Next Visit Plan progress with leg press, single leg bird dip, shoulder and UE with weights    PT Home Exercise Plan Access Code: [email or web address]    Consulted and Agree with Plan of Care Patient             Patient  will benefit from skilled therapeutic intervention in order to improve the following deficits and impairments:  Decreased strength, Impaired flexibility, Increased muscle spasms, Decreased range of motion  Visit Diagnosis: Muscle weakness (generalized)  Cramp and spasm     Problem List There are no problems to display for this patient.   Camillo Flaming Natalyah Cummiskey, PT 01/23/2021, 9:32 AM  [facility] [address] [place], [place], [address] Phone: [phone]   Fax:  [phone]  Name: Shannon Hardin MRN: [id number] Date of Birth: [birth year]

## 2021-01-30 ENCOUNTER — Encounter: Payer: BC Managed Care – PPO | Admitting: Physical Therapy

## 2021-02-06 ENCOUNTER — Other Ambulatory Visit: Payer: Self-pay

## 2021-02-06 ENCOUNTER — Ambulatory Visit: Payer: BC Managed Care – PPO | Attending: Obstetrics and Gynecology | Admitting: Physical Therapy

## 2021-02-06 DIAGNOSIS — R252 Cramp and spasm: Secondary | ICD-10-CM | POA: Insufficient documentation

## 2021-02-06 DIAGNOSIS — M6281 Muscle weakness (generalized): Secondary | ICD-10-CM | POA: Insufficient documentation

## 2021-02-06 NOTE — Therapy (Addendum)
[facility] [address], [place], [place], [address] Phone: [phone]   Fax:  [phone]  Physical Therapy Treatment  Patient Details  Name: Shannon Hardin MRN: [id number] Date of Birth: 1971-02-18 Referring Provider (PT): Marylynn Pearson, MD   Encounter Date: 02/06/2021   PT End of Session - 02/06/21 0909     Visit Number 3    Date for PT Re-Evaluation 02/20/21    Authorization Type bcbs    PT Start Time [phone]    PT Stop Time 0930    PT Time Calculation (min) 39 min    Activity Tolerance Patient tolerated treatment well    Behavior During Therapy [facility] for tasks assessed/performed             Past Medical History:  Diagnosis Date   No pertinent past medical history     Past Surgical History:  Procedure Laterality Date   calf implants  2000   bil   MASS EXCISION  06/10/2012   Procedure: EXCISION MASS;  Surgeon: Rolm Bookbinder, MD;  Location: [facility];  Service: General;  Laterality: Right;  right breast mass excision   SEPTOPLASTY  2004    There were no vitals filed for this visit.   Subjective Assessment - 02/06/21 0909     Subjective Pt had covid and did not do exercises as much as she wanted last 2 weeks    Pertinent History appendectomy    Currently in Pain? No/denies                               [facility] Adult PT Treatment/Exercise - 02/06/21 0001       Knee/Hip Exercises: Aerobic   Nustep L2 x 5 min PT giving cues to engage core      Knee/Hip Exercises: Machines for Strengthening   Total Gym Leg Press 60lb 20x bil; 40lb 2x10 single leg      Knee/Hip Exercises: Standing   SLS bird dips 10x each side    Gait Training side step with band regular and low      Shoulder Exercises: Standing   Flexion Strengthening;20 reps;Weights    Shoulder Flexion Weight (lbs) 2      Shoulder Exercises: Power Development worker, community 20 reps    Extension  Limitations 20lb    Row 20 reps    Row Limitations 20lb                      PT Short Term Goals - 02/06/21 0908       PT SHORT TERM GOAL #1   Title ind with first 2 initial HEPs    Status Achieved               PT Long Term Goals - 02/06/21 0908       PT LONG TERM GOAL #1   Title Pt will be ind with advanced HEP for functional strength and for muscle building/maintaining activities to reduce risks associated with osteoporosis.    Status Achieved      PT LONG TERM GOAL #2   Title Pt will demonstrate functional squat without excessive forward knee movement and good posture for improved functional lifting    Status Achieved      PT LONG TERM GOAL #3   Title Pt will feel confident that she knows what activities are safe and which are not due  to diagnosis of osteoporosis    Status Achieved                    Patient will benefit from skilled therapeutic intervention in order to improve the following deficits and impairments:     Visit Diagnosis: Muscle weakness (generalized)  Cramp and spasm     Problem List There are no problems to display for this patient.   Camillo Flaming Martisha Toulouse, PT 02/06/2021, 9:46 AM  [facility] [address] [place], [place], [address] Phone: [phone]   Fax:  [phone]  Name: Shannon Hardin MRN: [id number] Date of Birth: 1970-11-09   PHYSICAL THERAPY DISCHARGE SUMMARY  Visits from Start of Care: 3  Current functional level related to goals / functional outcomes: See goals met   Remaining deficits: See above   Education / Equipment: HEP   Patient agrees to discharge. Patient goals were met. Patient is being discharged due to meeting the stated rehab goals.  Gustavus Bryant, PT 02/06/21 9:48 AM

## 2023-07-31 ENCOUNTER — Other Ambulatory Visit: Payer: Self-pay | Admitting: Obstetrics and Gynecology

## 2023-07-31 DIAGNOSIS — Z1239 Encounter for other screening for malignant neoplasm of breast: Secondary | ICD-10-CM

## 2023-08-15 ENCOUNTER — Other Ambulatory Visit: Payer: Self-pay | Admitting: Obstetrics and Gynecology

## 2023-08-15 DIAGNOSIS — Z1239 Encounter for other screening for malignant neoplasm of breast: Secondary | ICD-10-CM

## 2023-09-10 ENCOUNTER — Ambulatory Visit
Admission: RE | Admit: 2023-09-10 | Discharge: 2023-09-10 | Disposition: A | Discharge status: Home / Self Care | Payer: Self-pay | Source: Ambulatory Visit | Attending: Obstetrics and Gynecology | Admitting: Obstetrics and Gynecology

## 2023-09-10 ENCOUNTER — Other Ambulatory Visit: Payer: BC Managed Care – PPO

## 2023-09-10 DIAGNOSIS — Z1239 Encounter for other screening for malignant neoplasm of breast: Secondary | ICD-10-CM

## 2023-09-10 MED ORDER — GADOPICLENOL 0.5 MMOL/ML IV SOLN
7.5000 mL | Freq: Once | INTRAVENOUS | Status: AC | PRN
  Administered 2023-09-10: 5 mL via INTRAVENOUS

## 2024-08-16 ENCOUNTER — Other Ambulatory Visit: Payer: Self-pay | Admitting: Obstetrics and Gynecology

## 2024-08-16 DIAGNOSIS — Z9189 Other specified personal risk factors, not elsewhere classified: Secondary | ICD-10-CM
